# Patient Record
Sex: Male | Born: 1990 | Race: Black or African American | Hispanic: No | Marital: Single | Smoking: Current some day smoker
Health system: Southern US, Community
[De-identification: ages and names within clinical notes are randomized; demographics above are authoritative.]

---
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
## 2009-08-31 ENCOUNTER — Emergency Department (HOSPITAL_COMMUNITY): Admission: EM | Admit: 2009-08-31 | Discharge: 2009-08-31 | Payer: Self-pay | Admitting: Emergency Medicine

## 2010-05-08 LAB — SAMPLE TO BLOOD BANK

## 2010-05-08 LAB — POCT I-STAT, CHEM 8
BUN: 16 mg/dL (ref 6–23)
Chloride: 107 mEq/L (ref 96–112)
Creatinine, Ser: 1.2 mg/dL (ref 0.4–1.5)
Glucose, Bld: 101 mg/dL — ABNORMAL HIGH (ref 70–99)
HCT: 49 % (ref 39.0–52.0)
Hemoglobin: 16.7 g/dL (ref 13.0–17.0)

## 2014-03-22 ENCOUNTER — Emergency Department: Payer: Self-pay | Admitting: Physician Assistant

## 2015-03-06 ENCOUNTER — Emergency Department
Admission: EM | Admit: 2015-03-06 | Discharge: 2015-03-06 | Disposition: A | Discharge status: Home / Self Care | Payer: Self-pay | Attending: Emergency Medicine | Admitting: Emergency Medicine

## 2015-03-06 DIAGNOSIS — K0889 Other specified disorders of teeth and supporting structures: Secondary | ICD-10-CM | POA: Insufficient documentation

## 2015-03-06 MED ORDER — PENICILLIN V POTASSIUM 250 MG PO TABS: 250.0000 mg | ORAL_TABLET | Freq: Four times a day (QID) | ORAL | Status: DC

## 2015-03-06 MED ORDER — FENTANYL CITRATE (PF) 100 MCG/2ML IJ SOLN: 50.0000 ug | Freq: Once | INTRAMUSCULAR | Status: DC

## 2015-03-06 MED ORDER — HYDROCODONE-ACETAMINOPHEN 5-325 MG PO TABS
2.0000 | ORAL_TABLET | Freq: Once | ORAL | Status: AC
  Administered 2015-03-06: 2 via ORAL
  Filled 2015-03-06: qty 2

## 2015-03-06 MED ORDER — KETOROLAC TROMETHAMINE 30 MG/ML IJ SOLN
30.0000 mg | Freq: Once | INTRAMUSCULAR | Status: AC
  Administered 2015-03-06: 30 mg via INTRAMUSCULAR
  Filled 2015-03-06: qty 1

## 2015-03-06 MED ORDER — PENICILLIN V POTASSIUM 250 MG PO TABS
250.0000 mg | ORAL_TABLET | Freq: Once | ORAL | Status: AC
  Administered 2015-03-06: 250 mg via ORAL
  Filled 2015-03-06: qty 1

## 2015-03-06 MED ORDER — HYDROCODONE-ACETAMINOPHEN 5-325 MG PO TABS: 1.0000 | ORAL_TABLET | ORAL | Status: DC | PRN

## 2015-03-06 MED ORDER — FENTANYL CITRATE (PF) 100 MCG/2ML IJ SOLN
INTRAMUSCULAR | Status: AC
  Filled 2015-03-06: qty 2

## 2015-03-06 NOTE — ED Provider Notes (Signed)
[facility] Emergency Department Provider Note  ____________________________________________  Time seen: On arrival  I have reviewed the triage vital signs and the nursing notes.   HISTORY  Chief Complaint Dental Pain    HPI Mayra Puhl is a 25 y.o. male presents with dental pain. He reports he has been having tooth pain for partially 1 week that is getting steadily worse. He denies intraoral swelling or difficulty swallowing. No fevers or chills. No facial swelling. He states he will see a dentist this week    No past medical history on file.  There are no active problems to display for this patient.   No past surgical history on file.  No current outpatient prescriptions on file.  Allergies Review of patient's allergies indicates no known allergies.  No family history on file.  Social History Social History  Substance Use Topics  . Smoking status: Not on file  . Smokeless tobacco: Not on file  . Alcohol Use: Not on file   denies recent alcohol use, occasional cigarette use  Review of Systems  Constitutional: Negative for fever. . ENT: Negative for sore throat     Skin: Negative for rash. Neurological: Negative for headaches    ____________________________________________   PHYSICAL EXAM:  VITAL SIGNS: ED Triage Vitals  Enc Vitals Group     BP 03/06/15 0215 131/86 mmHg     Pulse Rate 03/06/15 0215 72     Resp 03/06/15 0215 14     Temp 03/06/15 0215 98.1 F (36.7 C)     Temp Source 03/06/15 0215 Oral     SpO2 03/06/15 0215 99 %     Weight 03/06/15 0215 138 lb (62.596 kg)     Height 03/06/15 0215 5\' 9"  (1.753 m)     Head Cir --      Peak Flow --      Pain Score 03/06/15 0155 10     Pain Loc --      Pain Edu? --      Excl. in [place]? --      Constitutional: Alert and oriented. Well appearing and in no distress. Eyes: Conjunctivae are normal.  ENT   Head: Normocephalic and atraumatic.   Mouth/Throat:  Mucous membranes are moist. No evidence of dental abscess. Dental tenderness to palpation in the right third upper molar although no surrounding erythema   Neurologic:  Normal speech and language.  Skin:  Skin is warm, dry and intact. No rash noted. Psychiatric: Mood and affect are normal. Patient exhibits appropriate insight and judgment.  ____________________________________________    LABS (pertinent positives/negatives)  Labs Reviewed - No data to display  ____________________________________________     ____________________________________________    RADIOLOGY I have personally reviewed any xrays that were ordered on this patient: None  ____________________________________________   PROCEDURES  Procedure(s) performed: none   ____________________________________________   INITIAL IMPRESSION / ASSESSMENT AND PLAN / ED COURSE  Pertinent labs & imaging results that were available during my care of the patient were reviewed by me and considered in my medical decision making (see chart for details).  Pain medication and penicillin given for dental pain. Advised dental follow-up ASAP  ____________________________________________   FINAL CLINICAL IMPRESSION(S) / ED DIAGNOSES  Final diagnoses:  Pain, dental     Lavonia Drafts, MD 03/06/15 [phone]

## 2015-03-06 NOTE — ED Notes (Signed)
Pt reports pain to right upper back tooth for about 1 week that is getting worse.

## 2015-06-10 ENCOUNTER — Emergency Department
Admission: EM | Admit: 2015-06-10 | Discharge: 2015-06-10 | Disposition: A | Discharge status: Home / Self Care | Payer: Self-pay | Attending: Emergency Medicine | Admitting: Emergency Medicine

## 2015-06-10 ENCOUNTER — Encounter: Payer: Self-pay | Admitting: Medical Oncology

## 2015-06-10 DIAGNOSIS — L0201 Cutaneous abscess of face: Secondary | ICD-10-CM | POA: Insufficient documentation

## 2015-06-10 MED ORDER — IBUPROFEN 800 MG PO TABS: 800.0000 mg | ORAL_TABLET | Freq: Three times a day (TID) | ORAL | Status: DC | PRN

## 2015-06-10 MED ORDER — HYDROCODONE-ACETAMINOPHEN 5-325 MG PO TABS: 1.0000 | ORAL_TABLET | ORAL | Status: DC | PRN

## 2015-06-10 MED ORDER — SULFAMETHOXAZOLE-TRIMETHOPRIM 800-160 MG PO TABS: 1.0000 | ORAL_TABLET | Freq: Two times a day (BID) | ORAL | Status: DC

## 2015-06-10 NOTE — ED Notes (Signed)
Knot to rt side of face for a couple months.

## 2015-06-10 NOTE — ED Provider Notes (Signed)
[facility] Emergency Department Provider Note  ____________________________________________  Time seen: Approximately 7:46 PM  I have reviewed the triage vital signs and the nursing notes.   HISTORY  Chief Complaint Abscess    HPI Trevor Rocha is a 25 y.o. male presents for evaluation of not on a [place] of his face for last couple months however progressively has gotten worse over the last 2 days. Denies any trauma. 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 Denies any fever chills.   History reviewed. No pertinent past medical history.  There are no active problems to display for this patient.   History reviewed. No pertinent past surgical history.  Current Outpatient Rx  Name  Route  Sig  Dispense  Refill  . HYDROcodone-acetaminophen (NORCO) 5-325 MG tablet   Oral   Take 1-2 tablets by mouth every 4 (four) hours as needed for moderate pain.   15 tablet   0   . ibuprofen (ADVIL,MOTRIN) 800 MG tablet   Oral   Take 1 tablet (800 mg total) by mouth every 8 (eight) hours as needed.   30 tablet   0   . sulfamethoxazole-trimethoprim (BACTRIM DS,SEPTRA DS) 800-160 MG tablet   Oral   Take 1 tablet by mouth 2 (two) times daily.   20 tablet   0     Allergies Review of patient's allergies indicates no known allergies.  No family history on file.  Social History Social History  Substance Use Topics  . Smoking status: None  . Smokeless tobacco: None  . Alcohol Use: None    Review of Systems Constitutional: No fever/chills Eyes: No visual changes. ENT: No sore throat. Cardiovascular: Denies chest pain. Respiratory: Denies shortness of breath. Musculoskeletal: Negative for back pain. Skin: Positive for skin abscess to the right side of the cheek. Neurological: Negative for headaches, focal weakness or numbness.  10-point ROS otherwise  negative.  ____________________________________________   PHYSICAL EXAM:  VITAL SIGNS: ED Triage Vitals  Enc Vitals Group     BP 06/10/15 1852 132/76 mmHg     Pulse Rate 06/10/15 1852 65     Resp 06/10/15 1852 18     Temp 06/10/15 1852 98.6 F (37 C)     Temp Source 06/10/15 1852 Oral     SpO2 06/10/15 1852 100 %     Weight 06/10/15 1852 140 lb (63.504 kg)     Height 06/10/15 1852 5\' 9"  (1.753 m)     Head Cir --      Peak Flow --      Pain Score --      Pain Loc --      Pain Edu? --      Excl. in [place]? --     Constitutional: Alert and oriented. Well appearing and in no acute distress. Head: Atraumatic.Positive abscess approximately 1-1/2-2 cm nummular infraorbital to the right eye. On the upper cheek. Positive tenderness positive erythema no pustular discharge mild fluctuance. Nose: No congestion/rhinnorhea. Mouth/Throat: Mucous membranes are moist.  Oropharynx non-erythematous. Neck: No stridor.   Cardiovascular: Normal rate, regular rhythm. Grossly normal heart sounds.  Good peripheral circulation. Respiratory: Normal respiratory effort.  No retractions. Lungs CTAB. Skin:  Skin is warm, dry and intact. No rash noted. As described above Psychiatric: Mood and affect are normal. Speech and behavior are normal.  ____________________________________________   LABS (all labs ordered are listed, but only abnormal results  are displayed)  Labs Reviewed - No data to display ____________________________________________  EKG   ____________________________________________  RADIOLOGY   ____________________________________________   PROCEDURES  Procedure(s) performed: None  Critical Care performed: No  ____________________________________________   INITIAL IMPRESSION / ASSESSMENT AND PLAN / ED COURSE  Pertinent labs & imaging results that were available during my care of the patient were reviewed by me and considered in my medical decision making (see chart for  details).  Cutaneous abscess of the right cheek. Rx given for Bactrim DS twice a day, Motrin 800 mg 3 times a day and Norco 5/325. Patient follow-up PCP or return here with any worsening symptomology. Patient voices no other emergency medical complaints at this time. ____________________________________________   FINAL CLINICAL IMPRESSION(S) / ED DIAGNOSES  Final diagnoses:  Facial abscess     This chart was dictated using voice recognition software/Dragon. Despite best efforts to proofread, errors can occur which can change the meaning. Any change was purely unintentional.   Arlyss Repress, PA-C 06/10/15 1957  Wandra Arthurs, MD 06/10/15 [phone]

## 2015-06-10 NOTE — ED Notes (Signed)
Pt presents to ED w/ abscess to right cheek bone x 3 days; pt reports pain/tenderness.  Area swollen, tender, no discharge from site.  PA at bedside.

## 2015-06-10 NOTE — Discharge Instructions (Signed)

## 2015-11-13 ENCOUNTER — Emergency Department
Admission: EM | Admit: 2015-11-13 | Discharge: 2015-11-13 | Disposition: A | Discharge status: Home / Self Care | Payer: Self-pay | Attending: Emergency Medicine | Admitting: Emergency Medicine

## 2015-11-13 ENCOUNTER — Encounter: Payer: Self-pay | Admitting: Emergency Medicine

## 2015-11-13 ENCOUNTER — Emergency Department: Payer: Self-pay

## 2015-11-13 DIAGNOSIS — Z5181 Encounter for therapeutic drug level monitoring: Secondary | ICD-10-CM | POA: Insufficient documentation

## 2015-11-13 DIAGNOSIS — R1115 Cyclical vomiting syndrome unrelated to migraine: Secondary | ICD-10-CM

## 2015-11-13 DIAGNOSIS — F17200 Nicotine dependence, unspecified, uncomplicated: Secondary | ICD-10-CM | POA: Insufficient documentation

## 2015-11-13 DIAGNOSIS — G43A0 Cyclical vomiting, not intractable: Secondary | ICD-10-CM | POA: Insufficient documentation

## 2015-11-13 LAB — COMPREHENSIVE METABOLIC PANEL
ALBUMIN: 4.4 g/dL (ref 3.5–5.0)
ALT: 22 U/L (ref 17–63)
ANION GAP: 14 (ref 5–15)
AST: 42 U/L — ABNORMAL HIGH (ref 15–41)
Alkaline Phosphatase: 52 U/L (ref 38–126)
BILIRUBIN TOTAL: 0.6 mg/dL (ref 0.3–1.2)
BUN: 18 mg/dL (ref 6–20)
CALCIUM: 9 mg/dL (ref 8.9–10.3)
CO2: 20 mmol/L — ABNORMAL LOW (ref 22–32)
Chloride: 103 mmol/L (ref 101–111)
Creatinine, Ser: 1.07 mg/dL (ref 0.61–1.24)
GLUCOSE: 83 mg/dL (ref 65–99)
POTASSIUM: 3.9 mmol/L (ref 3.5–5.1)
Sodium: 137 mmol/L (ref 135–145)
TOTAL PROTEIN: 7.6 g/dL (ref 6.5–8.1)

## 2015-11-13 LAB — URINALYSIS COMPLETE WITH MICROSCOPIC (ARMC ONLY)
BACTERIA UA: NONE SEEN
BILIRUBIN URINE: NEGATIVE
GLUCOSE, UA: NEGATIVE mg/dL
LEUKOCYTES UA: NEGATIVE
NITRITE: NEGATIVE
PH: 5 (ref 5.0–8.0)
Protein, ur: 30 mg/dL — AB
SPECIFIC GRAVITY, URINE: 1.023 (ref 1.005–1.030)
SQUAMOUS EPITHELIAL / LPF: NONE SEEN

## 2015-11-13 LAB — ETHANOL: ALCOHOL ETHYL (B): 12 mg/dL — AB (ref <5)

## 2015-11-13 LAB — CBC WITH DIFFERENTIAL/PLATELET
BASOS ABS: 0 10*3/uL (ref 0–0.1)
BASOS PCT: 0 %
Eosinophils Absolute: 0 10*3/uL (ref 0–0.7)
Eosinophils Relative: 0 %
HEMATOCRIT: 42.6 % (ref 40.0–52.0)
Hemoglobin: 13.9 g/dL (ref 13.0–18.0)
LYMPHS PCT: 12 %
Lymphs Abs: 2.2 10*3/uL (ref 1.0–3.6)
MCH: 29.1 pg (ref 26.0–34.0)
MCHC: 32.6 g/dL (ref 32.0–36.0)
MCV: 89.4 fL (ref 80.0–100.0)
Monocytes Absolute: 0.7 10*3/uL (ref 0.2–1.0)
Monocytes Relative: 4 %
NEUTROS ABS: 15 10*3/uL — AB (ref 1.4–6.5)
NEUTROS PCT: 84 %
Platelets: 190 10*3/uL (ref 150–440)
RBC: 4.76 MIL/uL (ref 4.40–5.90)
RDW: 14.3 % (ref 11.5–14.5)
WBC: 17.9 10*3/uL — AB (ref 3.8–10.6)

## 2015-11-13 LAB — URINE DRUG SCREEN, QUALITATIVE (ARMC ONLY)
AMPHETAMINES, UR SCREEN: NOT DETECTED
BENZODIAZEPINE, UR SCRN: NOT DETECTED
Barbiturates, Ur Screen: NOT DETECTED
Cannabinoid 50 Ng, Ur ~~LOC~~: POSITIVE — AB
Cocaine Metabolite,Ur ~~LOC~~: NOT DETECTED
MDMA (ECSTASY) UR SCREEN: NOT DETECTED
METHADONE SCREEN, URINE: NOT DETECTED
OPIATE, UR SCREEN: NOT DETECTED
PHENCYCLIDINE (PCP) UR S: NOT DETECTED
Tricyclic, Ur Screen: NOT DETECTED

## 2015-11-13 MED ORDER — SODIUM CHLORIDE 0.9 % IV SOLN
Freq: Once | INTRAVENOUS | Status: AC
  Administered 2015-11-13: 08:00:00 via INTRAVENOUS

## 2015-11-13 MED ORDER — ONDANSETRON HCL 4 MG/2ML IJ SOLN
INTRAMUSCULAR | Status: AC
  Filled 2015-11-13: qty 2

## 2015-11-13 MED ORDER — SODIUM CHLORIDE 0.9 % IV SOLN: Freq: Once | INTRAVENOUS | Status: DC

## 2015-11-13 MED ORDER — ONDANSETRON HCL 4 MG/2ML IJ SOLN
4.0000 mg | Freq: Once | INTRAMUSCULAR | Status: AC
  Administered 2015-11-13: 4 mg via INTRAVENOUS

## 2015-11-13 NOTE — ED Triage Notes (Signed)
Pt to ed via ems from home with vomiting. Pt states when he woke up his sheets were covered in sweat. Pt c/o n/v. Pt states he drank about 4-5 shots of tequila last night.

## 2015-11-13 NOTE — Discharge Instructions (Signed)
Please return for fever abdominal pain or continued vomiting.

## 2015-11-13 NOTE — ED Provider Notes (Signed)
[facility] Emergency Department Provider Note   ____________________________________________   First MD Initiated Contact with Patient 11/13/15 [id number]     (approximate)  I have reviewed the triage vital signs and the nursing notes.   HISTORY  Chief Complaint Emesis    HPI Trevor Rocha is a 25 y.o. male patient reports he was drinking last night and had 4-5 shots of vodka. He doesn't usually drink quite that much. Went to bed and when he woke up in the morning he vomited some and was sweating like crazy some sheets were soaked. He's never sweated that much either. Now he just feels cold and he shivering in the ER. Nothing is really hurting and he is not coughing but he was very worried about all the sweating.   History reviewed. No pertinent past medical history.  There are no active problems to display for this patient.   History reviewed. No pertinent surgical history.  Prior to Admission medications   Medication Sig Start Date End Date Taking? Authorizing Provider  HYDROcodone-acetaminophen (NORCO) 5-325 MG tablet Take 1-2 tablets by mouth every 4 (four) hours as needed for moderate pain. 06/10/15   Pierce Crane Beers, PA-C  ibuprofen (ADVIL,MOTRIN) 800 MG tablet Take 1 tablet (800 mg total) by mouth every 8 (eight) hours as needed. 06/10/15   Pierce Crane Beers, PA-C  sulfamethoxazole-trimethoprim (BACTRIM DS,SEPTRA DS) 800-160 MG tablet Take 1 tablet by mouth 2 (two) times daily. 06/10/15   Arlyss Repress, PA-C    Allergies Review of patient's allergies indicates no known allergies.  No family history on file.  Social History Social History  Substance Use Topics  . Smoking status: Current Some Day Smoker  . Smokeless tobacco: Never Used  . Alcohol use Yes     Comment: "I had about 4-5 shots of tequila last night"    Review of Systems Constitutional: No feverPatient is shivering Eyes: No visual changes. ENT: No sore  throat. Cardiovascular: Denies chest pain. Respiratory: Denies shortness of breath. Gastrointestinal: No abdominal pain.   No diarrhea.  No constipation. Genitourinary: Negative for dysuria. Musculoskeletal: Negative for back pain. Skin: Negative for rash. Neurological: Negative for headaches, focal weakness or numbness.  10-point ROS otherwise negative.  ____________________________________________   PHYSICAL EXAM:  VITAL SIGNS: ED Triage Vitals [11/13/15 0712]  Enc Vitals Group     BP 117/64     Pulse Rate 65     Resp (!) 22     Temp      Temp Source Oral     SpO2 100 %     Weight 140 lb (63.5 kg)     Height 5\' 10"  (1.778 m)     Head Circumference      Peak Flow      Pain Score      Pain Loc      Pain Edu?      Excl. in [place]?     Constitutional: Alert and oriented. Well appearing and in no acute distress. Eyes: Conjunctivae are normal. PERRL. EOMI. Head: Atraumatic. Nose: No congestion/rhinnorhea. Mouth/Throat: Mucous membranes are moist.  Oropharynx non-erythematous. Neck: No stridor.   Cardiovascular: Normal rate, regular rhythm. Grossly normal heart sounds.  Good peripheral circulation. Respiratory: Normal respiratory effort.  No retractions. Lungs CTAB. Gastrointestinal: Soft and nontender. No distention. No abdominal bruits. No CVA tenderness. Musculoskeletal: No lower extremity tenderness nor edema.  No joint effusions. Neurologic:  Normal speech and language. No gross focal neurologic deficits are appreciated. No  gait instability. Skin:  Skin is warm, dry and intact. No rash noted. Psychiatric: Mood and affect are normal. Speech and behavior are normal.  ____________________________________________   LABS (all labs ordered are listed, but only abnormal results are displayed)  Labs Reviewed  COMPREHENSIVE METABOLIC PANEL - Abnormal; Notable for the following:       Result Value   CO2 20 (*)    AST 42 (*)    All other components within normal limits   CBC WITH DIFFERENTIAL/PLATELET - Abnormal; Notable for the following:    WBC 17.9 (*)    Neutro Abs 15.0 (*)    All other components within normal limits  URINE DRUG SCREEN, QUALITATIVE ([facility] ONLY) - Abnormal; Notable for the following:    Cannabinoid 50 Ng, Ur [place] POSITIVE (*)    All other components within normal limits  ETHANOL - Abnormal; Notable for the following:    Alcohol, Ethyl (B) 12 (*)    All other components within normal limits  URINALYSIS COMPLETEWITH MICROSCOPIC ([facility] ONLY) - Abnormal; Notable for the following:    Color, Urine YELLOW (*)    APPearance CLEAR (*)    Ketones, ur 1+ (*)    Hgb urine dipstick 1+ (*)    Protein, ur 30 (*)    All other components within normal limits   ____________________________________________  EKG  ____________________________________________  RADIOLOGY  CHEST  2 VIEW  COMPARISON:  None.  FINDINGS: The cardiomediastinal silhouette is within normal limits. The lungs are well inflated and clear. There is no evidence of pleural effusion or pneumothorax. No acute osseous abnormality is identified.  IMPRESSION: No active cardiopulmonary disease.   Electronically Signed   By: Logan Bores M.D.   On: 11/13/2015 09:55 ____________________________________________   PROCEDURES  Procedure(s) performed:  Procedures  Critical Care performed:   ____________________________________________   INITIAL IMPRESSION / ASSESSMENT AND PLAN / ED COURSE  Pertinent labs & imaging results that were available during my care of the patient were reviewed by me and considered in my medical decision making (see chart for details).  Patient is feeling better now everything looks okay have no explanation for the elevated white count with him to home.  Clinical Course     ____________________________________________   FINAL CLINICAL IMPRESSION(S) / ED DIAGNOSES  Final diagnoses:  Non-intractable cyclical vomiting with  nausea      NEW MEDICATIONS STARTED DURING THIS VISIT:  New Prescriptions   No medications on file     Note:  This document was prepared using Dragon voice recognition software and may include unintentional dictation errors.    Nena Polio, MD 11/13/15 [phone]

## 2015-12-29 ENCOUNTER — Encounter: Payer: Self-pay | Admitting: Occupational Medicine

## 2015-12-29 ENCOUNTER — Emergency Department
Admission: EM | Admit: 2015-12-29 | Discharge: 2015-12-29 | Disposition: A | Discharge status: Home / Self Care | Payer: No Typology Code available for payment source | Attending: Emergency Medicine | Admitting: Emergency Medicine

## 2015-12-29 DIAGNOSIS — S0990XA Unspecified injury of head, initial encounter: Secondary | ICD-10-CM | POA: Insufficient documentation

## 2015-12-29 DIAGNOSIS — Y939 Activity, unspecified: Secondary | ICD-10-CM | POA: Diagnosis not present

## 2015-12-29 DIAGNOSIS — Y92410 Unspecified street and highway as the place of occurrence of the external cause: Secondary | ICD-10-CM | POA: Diagnosis not present

## 2015-12-29 DIAGNOSIS — F17200 Nicotine dependence, unspecified, uncomplicated: Secondary | ICD-10-CM | POA: Diagnosis not present

## 2015-12-29 DIAGNOSIS — Y999 Unspecified external cause status: Secondary | ICD-10-CM | POA: Insufficient documentation

## 2015-12-29 DIAGNOSIS — S39012A Strain of muscle, fascia and tendon of lower back, initial encounter: Secondary | ICD-10-CM | POA: Insufficient documentation

## 2015-12-29 DIAGNOSIS — S3992XA Unspecified injury of lower back, initial encounter: Secondary | ICD-10-CM | POA: Diagnosis present

## 2015-12-29 MED ORDER — CYCLOBENZAPRINE HCL 10 MG PO TABS: 10.0000 mg | ORAL_TABLET | Freq: Three times a day (TID) | ORAL | 0 refills | Status: DC | PRN

## 2015-12-29 MED ORDER — NAPROXEN 500 MG PO TABS: 500.0000 mg | ORAL_TABLET | Freq: Two times a day (BID) | ORAL | 0 refills | Status: DC
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
## 2016-10-04 ENCOUNTER — Encounter: Payer: Self-pay | Admitting: Emergency Medicine

## 2016-10-04 ENCOUNTER — Emergency Department
Admission: EM | Admit: 2016-10-04 | Discharge: 2016-10-04 | Disposition: A | Discharge status: Home / Self Care | Payer: Self-pay | Attending: Emergency Medicine | Admitting: Emergency Medicine

## 2016-10-04 DIAGNOSIS — J029 Acute pharyngitis, unspecified: Secondary | ICD-10-CM | POA: Insufficient documentation

## 2016-10-04 DIAGNOSIS — F17200 Nicotine dependence, unspecified, uncomplicated: Secondary | ICD-10-CM | POA: Insufficient documentation

## 2016-10-04 LAB — POCT RAPID STREP A: STREPTOCOCCUS, GROUP A SCREEN (DIRECT): NEGATIVE

## 2016-10-04 MED ORDER — LIDOCAINE VISCOUS 2 % MT SOLN: 10.0000 mL | OROMUCOSAL | 0 refills | Status: DC | PRN

## 2016-10-04 MED ORDER — PREDNISONE 10 MG PO TABS: ORAL_TABLET | ORAL | 0 refills | Status: DC

## 2016-10-04 MED ORDER — DEXAMETHASONE SODIUM PHOSPHATE 10 MG/ML IJ SOLN
10.0000 mg | Freq: Once | INTRAMUSCULAR | Status: AC
  Administered 2016-10-04: 10 mg via INTRAMUSCULAR
  Filled 2016-10-04: qty 1

## 2016-10-04 MED ORDER — LIDOCAINE VISCOUS 2 % MT SOLN
15.0000 mL | Freq: Once | OROMUCOSAL | Status: AC
  Administered 2016-10-04: 15 mL via OROMUCOSAL
  Filled 2016-10-04: qty 15

## 2016-10-04 MED ORDER — DIPHENHYDRAMINE HCL 25 MG PO CAPS
25.0000 mg | ORAL_CAPSULE | Freq: Once | ORAL | Status: AC
  Administered 2016-10-04: 25 mg via ORAL
  Filled 2016-10-04: qty 1

## 2016-10-04 NOTE — ED Notes (Signed)
See triage note  States developed sore throat about 4 days ago   Sx's are worse today  having increased pain with swallowing  Unsure of fever at home but afebrile on arrival

## 2016-10-04 NOTE — ED Triage Notes (Signed)
Pt to ed with c/o sore throat x 4 days.  Pt reports unable to swallow saliva due to pain. Denies fever.

## 2016-10-04 NOTE — ED Provider Notes (Signed)
[facility] Emergency Department Provider Note  ____________________________________________  Time seen: Approximately 10:13 AM  I have reviewed the triage vital signs and the nursing notes.   HISTORY  Chief Complaint Sore Throat    HPI Trevor Rocha is a 26 y.o. male that presents to the emergency department with a sore throat for 4 days.He is having difficulty swallowing due to pain. Pain is on the inside of his throat. No sick contacts. He denies fever, nasal congestion, cough, shortness breath, chest pain, nausea, vomiting, abdominal pain.   History reviewed. No pertinent past medical history.  There are no active problems to display for this patient.   History reviewed. No pertinent surgical history.  Prior to Admission medications   Medication Sig Start Date End Date Taking? Authorizing Provider  cyclobenzaprine (FLEXERIL) 10 MG tablet Take 1 tablet (10 mg total) by mouth 3 (three) times daily as needed for muscle spasms. 12/29/15   Triplett, Johnette Abraham B, FNP  HYDROcodone-acetaminophen (NORCO) 5-325 MG tablet Take 1-2 tablets by mouth every 4 (four) hours as needed for moderate pain. 06/10/15   Beers, Pierce Crane, PA-C  lidocaine (XYLOCAINE) 2 % solution Use as directed 10 mLs in the mouth or throat as needed for mouth pain. 10/04/16   Laban Emperor, PA-C  naproxen (NAPROSYN) 500 MG tablet Take 1 tablet (500 mg total) by mouth 2 (two) times daily with a meal. 12/29/15   Triplett, Cari B, FNP  predniSONE (DELTASONE) 10 MG tablet Take 6 tablets on day 1, take 5 tablets on day 2, take 4 tablets on day 3, take 3 tablets on day 4, take 2 tablets on day 5, take 1 tablet on day 6 10/04/16   Laban Emperor, PA-C    Allergies Patient has no known allergies.  No family history on file.  Social History Social History  Substance Use Topics  . Smoking status: Current Some Day Smoker  . Smokeless tobacco: Never Used  . Alcohol use Yes     Comment: "I had about  4-5 shots of tequila last night"     Review of Systems  Constitutional: No fever/chills ENT: Negative for congestion and rhinorrhea. Cardiovascular: No chest pain. Respiratory: Negative for cough. No SOB. Gastrointestinal: No abdominal pain.  No nausea, no vomiting.  No diarrhea.  No constipation. Musculoskeletal: Negative for musculoskeletal pain. Skin: Negative for rash, abrasions, lacerations, ecchymosis. Neurological: Negative for headaches.   ____________________________________________   PHYSICAL EXAM:  VITAL SIGNS: ED Triage Vitals [10/04/16 0953]  Enc Vitals Group     BP (!) 141/91     Pulse Rate 93     Resp 20     Temp 97.6 F (36.4 C)     Temp Source Oral     SpO2 100 %     Weight 130 lb (59 kg)     Height 5\' 10"  (1.778 m)     Head Circumference      Peak Flow      Pain Score 10     Pain Loc      Pain Edu?      Excl. in [place]?      Constitutional: Alert and oriented. Well appearing and in no acute distress. Eyes: Conjunctivae are normal. PERRL. EOMI. No discharge. Head: Atraumatic. ENT: No frontal and maxillary sinus tenderness.      Ears: Tympanic membranes pearly gray with good landmarks. No discharge.      Nose: No congestion/rhinnorhea.      Mouth/Throat: Mucous membranes are  moist. Oropharynx erythematous. Tonsils not enlarged. No exudates. Uvula midline. Neck: No stridor.   Hematological/Lymphatic/Immunilogical: No cervical lymphadenopathy. Cardiovascular: Normal rate, regular rhythm.  Good peripheral circulation. Respiratory: Normal respiratory effort without tachypnea or retractions. Lungs CTAB. Good air entry to the bases with no decreased or absent breath sounds. Gastrointestinal: Bowel sounds 4 quadrants. Soft and nontender to palpation. No guarding or rigidity. No palpable masses. No distention. Musculoskeletal: Full range of motion to all extremities. No gross deformities appreciated. Neurologic:  Normal speech and language. No gross focal  neurologic deficits are appreciated.  Skin:  Skin is warm, dry and intact. No rash noted.   ____________________________________________   LABS (all labs ordered are listed, but only abnormal results are displayed)  Labs Reviewed  POCT RAPID STREP A   ____________________________________________  EKG   ____________________________________________  RADIOLOGY  No results found.  ____________________________________________    PROCEDURES  Procedure(s) performed:    Procedures    Medications  dexamethasone (DECADRON) injection 10 mg (10 mg Intramuscular Given 10/04/16 1140)  lidocaine (XYLOCAINE) 2 % viscous mouth solution 15 mL (15 mLs Mouth/Throat Given 10/04/16 1141)  diphenhydrAMINE (BENADRYL) capsule 25 mg (25 mg Oral Given 10/04/16 1141)     ____________________________________________   INITIAL IMPRESSION / ASSESSMENT AND PLAN / ED COURSE  Pertinent labs & imaging results that were available during my care of the patient were reviewed by me and considered in my medical decision making (see chart for details).  Review of the [place] CSRS was performed in accordance of the [place] prior to dispensing any controlled drugs.     Patient's diagnosis is consistent with viral pharyngitis. Vital signs and exam are reassuring. Strep was negative. Patient felt extremely better after viscous lidocaine. Patient will be discharged home with prescriptions for prednisone and lidocaine. Patient is to follow up with PCP as needed or otherwise directed. Patient is given ED precautions to return to the ED for any worsening or new symptoms.     ____________________________________________  FINAL CLINICAL IMPRESSION(S) / ED DIAGNOSES  Final diagnoses:  Viral pharyngitis      NEW MEDICATIONS STARTED DURING THIS VISIT:  Discharge Medication List as of 10/04/2016 12:01 PM    START taking these medications   Details  lidocaine (XYLOCAINE) 2 % solution Use as directed 10 mLs  in the mouth or throat as needed for mouth pain., Starting Wed 10/04/2016, Print    predniSONE (DELTASONE) 10 MG tablet Take 6 tablets on day 1, take 5 tablets on day 2, take 4 tablets on day 3, take 3 tablets on day 4, take 2 tablets on day 5, take 1 tablet on day 6, Print            This chart was dictated using voice recognition software/Dragon. Despite best efforts to proofread, errors can occur which can change the meaning. Any change was purely unintentional.    Laban Emperor, PA-C 10/04/16 1846    Earleen Newport, MD 10/05/16 1501

## 2017-02-09 IMAGING — CR DG CHEST 2V
2 series · 3 of 3 positions shown · non-contrast
Comparison: None.

CLINICAL DATA: Passed out from alcohol use. Sweating in shivering.
Smoker. Evaluate for aspiration.

EXAM:
CHEST  2 VIEW

[chest lat]
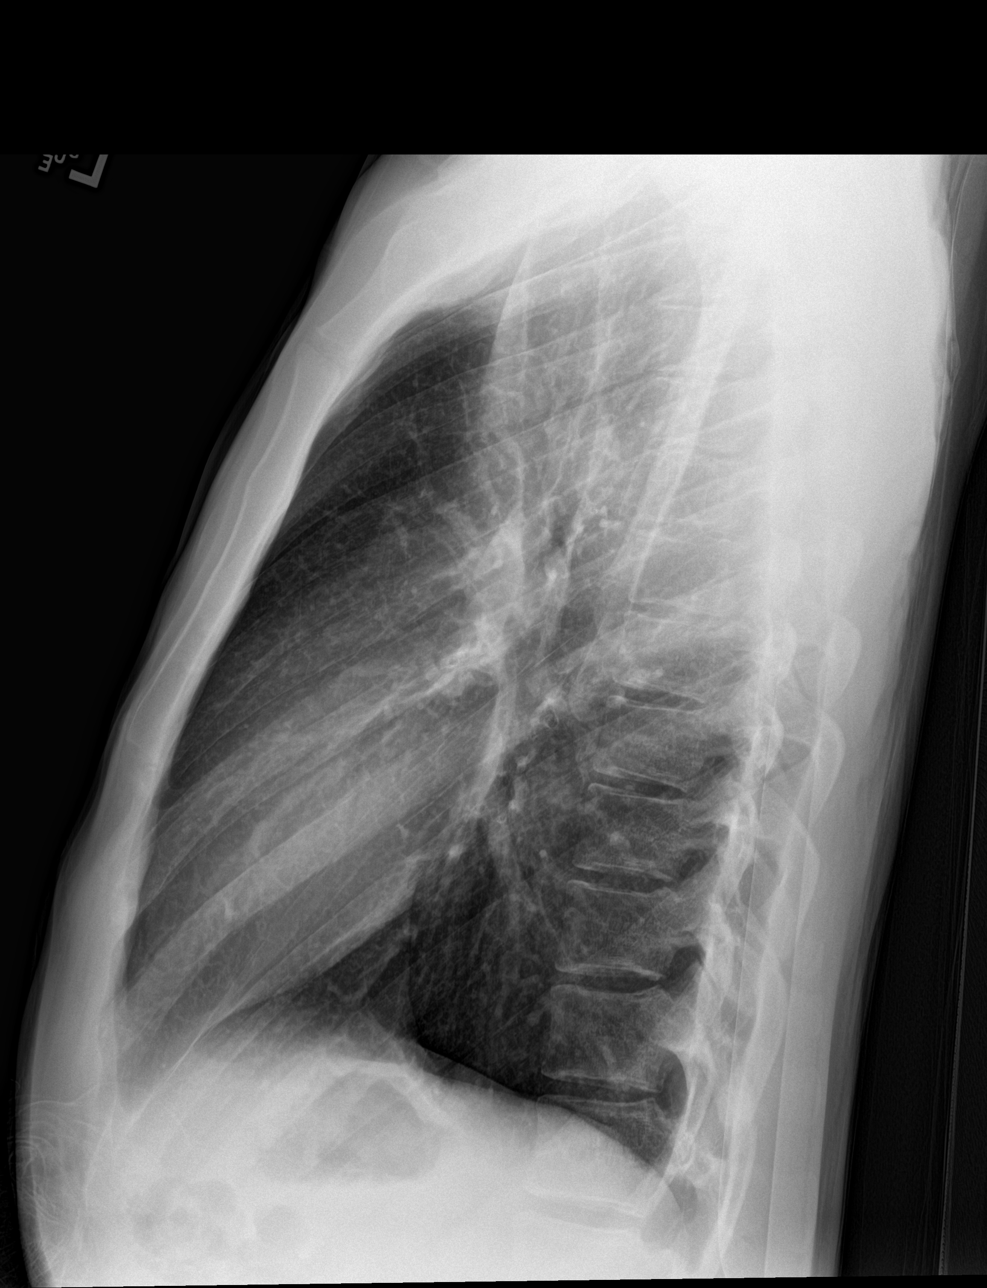

[Series 3: chest ap · 0.14mm/px · 2 of 2 slices shown]
[im 1/2]
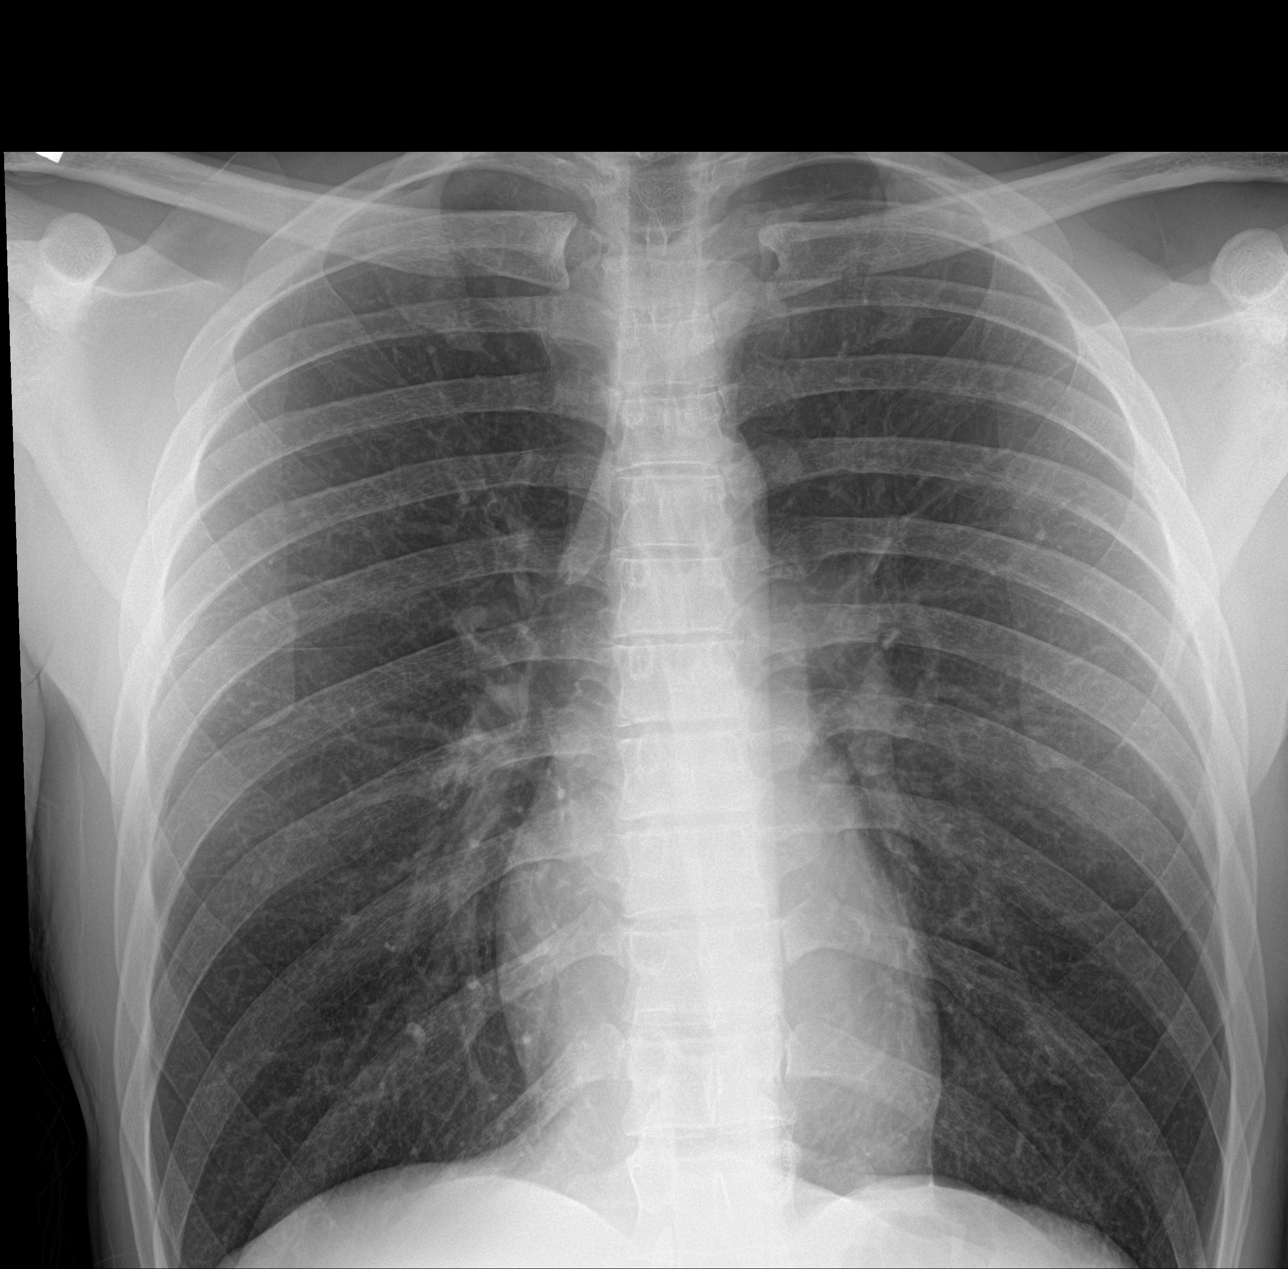
[im 2/2]
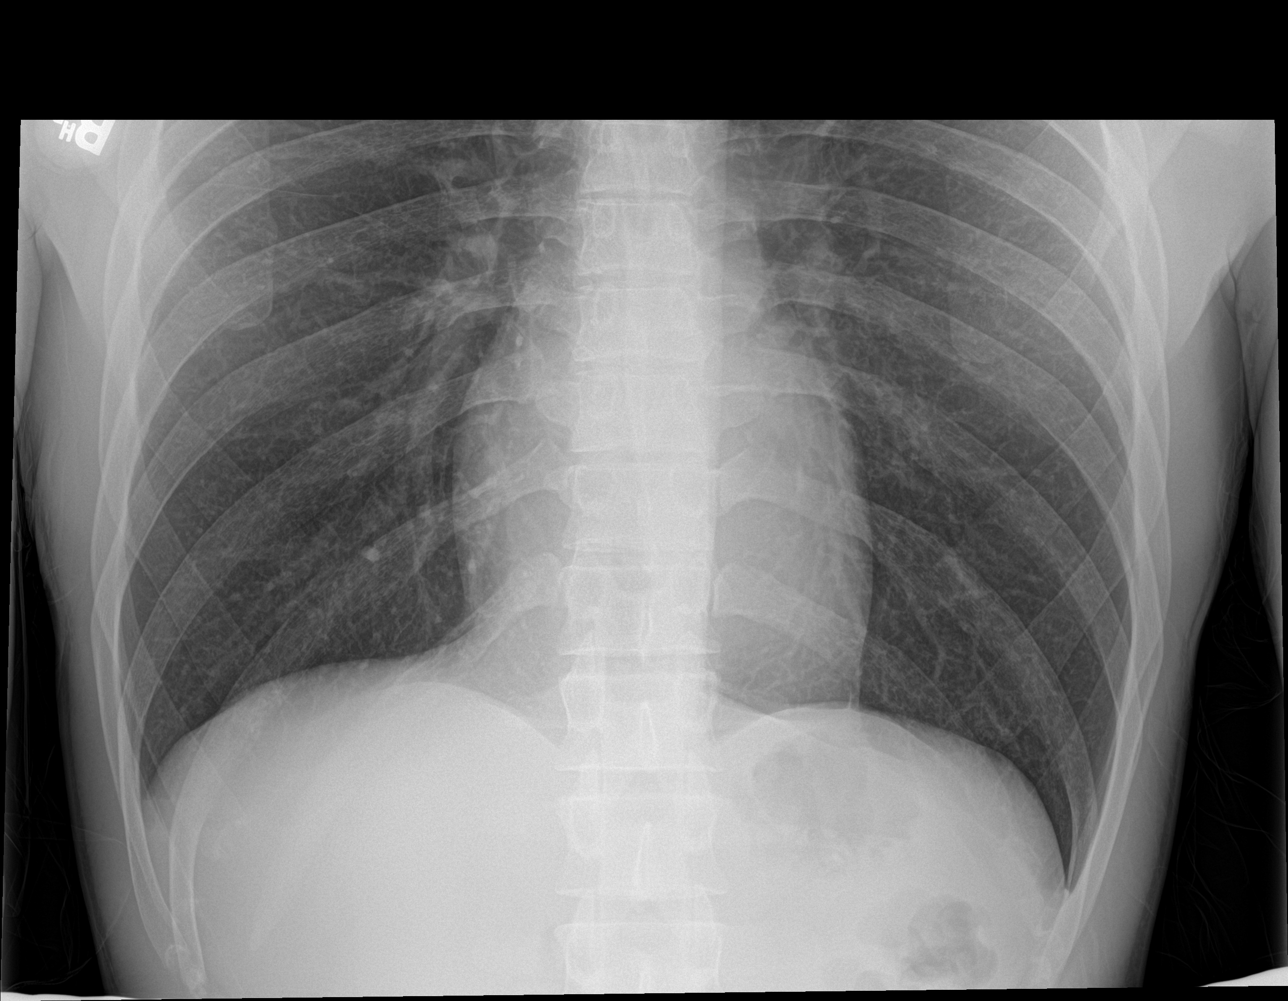

[3 of 3 positions shown; findings below may reference images not displayed]

FINDINGS: The cardiomediastinal silhouette is within normal limits. The lungs
are well inflated and clear. There is no evidence of pleural
effusion or pneumothorax. No acute osseous abnormality is
identified.
IMPRESSION: No active cardiopulmonary disease.

## 2018-10-28 ENCOUNTER — Other Ambulatory Visit: Payer: Self-pay

## 2018-10-28 ENCOUNTER — Emergency Department: Payer: Self-pay

## 2018-10-28 ENCOUNTER — Emergency Department
Admission: EM | Admit: 2018-10-28 | Discharge: 2018-10-28 | Disposition: A | Discharge status: Home / Self Care | Payer: Self-pay | Attending: Emergency Medicine | Admitting: Emergency Medicine

## 2018-10-28 ENCOUNTER — Encounter: Payer: Self-pay | Admitting: Emergency Medicine

## 2018-10-28 DIAGNOSIS — L02511 Cutaneous abscess of right hand: Secondary | ICD-10-CM | POA: Insufficient documentation

## 2018-10-28 DIAGNOSIS — F17210 Nicotine dependence, cigarettes, uncomplicated: Secondary | ICD-10-CM | POA: Insufficient documentation

## 2018-10-28 MED ORDER — BUPIVACAINE HCL (PF) 0.5 % IJ SOLN
10.0000 mL | Freq: Once | INTRAMUSCULAR | Status: AC
  Administered 2018-10-28: 06:00:00 10 mL
  Filled 2018-10-28: qty 30

## 2018-10-28 MED ORDER — SULFAMETHOXAZOLE-TRIMETHOPRIM 800-160 MG PO TABS: 1.0000 | ORAL_TABLET | Freq: Two times a day (BID) | ORAL | 0 refills | Status: AC

## 2018-10-28 MED ORDER — LIDOCAINE HCL (PF) 1 % IJ SOLN
5.0000 mL | Freq: Once | INTRAMUSCULAR | Status: AC
  Administered 2018-10-28: 5 mL
  Filled 2018-10-28: qty 5

## 2018-10-28 MED ORDER — CEPHALEXIN 500 MG PO CAPS: 500.0000 mg | ORAL_CAPSULE | Freq: Three times a day (TID) | ORAL | 0 refills | Status: AC

## 2018-10-28 MED ORDER — HYDROCODONE-ACETAMINOPHEN 5-325 MG PO TABS
2.0000 | ORAL_TABLET | Freq: Once | ORAL | Status: AC
  Administered 2018-10-28: 06:00:00 2 via ORAL
  Filled 2018-10-28: qty 2

## 2018-10-28 MED ORDER — IBUPROFEN 600 MG PO TABS: 600.0000 mg | ORAL_TABLET | Freq: Three times a day (TID) | ORAL | 0 refills | Status: DC | PRN

## 2018-10-28 NOTE — ED Triage Notes (Addendum)
Pt says a over a month ago he got a small cut on his right index finger and he's been trying to nurse it at home with neosporin; now he has pain and swelling; is concerned he has an infection; the pain is keeping him from sleeping; pt ambulatory with steady gait; talking in complete coherent sentences

## 2018-10-28 NOTE — ED Provider Notes (Signed)
[facility] Emergency Department Provider Note  ____________________________________________   First MD Initiated Contact with Patient 10/28/18 [phone]     (approximate)  I have reviewed the triage vital signs and the nursing notes.   HISTORY  Chief Complaint Finger Injury    HPI Trevor Rocha is a 28 y.o. male  Here with righ tindex finger pain. Pt reports that approx 1 month ago, he develop a small wound to his right index finger at work. Works in [employer]. Since then, he's had persistent pain and a draining wound to the area, which intermittently will "heal up" then come back. He reports that he has an aching, throbbing pain constantly but over the past few days, this has worsened and he's begun to have new drainage. No fever, chills. No pain proximal to his finger. Denies any concern for FB. Tetanus is UTD.        History reviewed. No pertinent past medical history.  There are no active problems to display for this patient.   History reviewed. No pertinent surgical history.  Prior to Admission medications   Medication Sig Start Date End Date Taking? Authorizing Provider  cephALEXin (KEFLEX) 500 MG capsule Take 1 capsule (500 mg total) by mouth 3 (three) times daily for 7 days. 10/28/18 11/04/18  Duffy Bruce, MD  cyclobenzaprine (FLEXERIL) 10 MG tablet Take 1 tablet (10 mg total) by mouth 3 (three) times daily as needed for muscle spasms. 12/29/15   Triplett, Johnette Abraham B, FNP  HYDROcodone-acetaminophen (NORCO) 5-325 MG tablet Take 1-2 tablets by mouth every 4 (four) hours as needed for moderate pain. 06/10/15   Beers, Pierce Crane, PA-C  ibuprofen (ADVIL) 600 MG tablet Take 1 tablet (600 mg total) by mouth every 8 (eight) hours as needed for moderate pain. 10/28/18   Duffy Bruce, MD  lidocaine (XYLOCAINE) 2 % solution Use as directed 10 mLs in the mouth or throat as needed for mouth pain. 10/04/16   Laban Emperor, PA-C  naproxen (NAPROSYN) 500 MG  tablet Take 1 tablet (500 mg total) by mouth 2 (two) times daily with a meal. 12/29/15   Triplett, Cari B, FNP  predniSONE (DELTASONE) 10 MG tablet Take 6 tablets on day 1, take 5 tablets on day 2, take 4 tablets on day 3, take 3 tablets on day 4, take 2 tablets on day 5, take 1 tablet on day 6 10/04/16   Laban Emperor, PA-C  sulfamethoxazole-trimethoprim (BACTRIM DS) 800-160 MG tablet Take 1 tablet by mouth 2 (two) times daily for 7 days. 10/28/18 11/04/18  Duffy Bruce, MD    Allergies Patient has no known allergies.  History reviewed. No pertinent family history.  Social History Social History   Tobacco Use  . Smoking status: Current Some Day Smoker  . Smokeless tobacco: Never Used  Substance Use Topics  . Alcohol use: Yes  . Drug use: Yes    Types: Marijuana    Review of Systems  Review of Systems  Constitutional: Negative for chills, fatigue and fever.  HENT: Negative for sore throat.   Respiratory: Negative for shortness of breath.   Cardiovascular: Negative for chest pain.  Gastrointestinal: Negative for abdominal pain.  Genitourinary: Negative for flank pain.  Musculoskeletal: Negative for neck pain.  Skin: Positive for rash and wound.  Allergic/Immunologic: Negative for immunocompromised state.  Neurological: Negative for weakness and numbness.  Hematological: Does not bruise/bleed easily.     ____________________________________________  PHYSICAL EXAM:      VITAL SIGNS: ED Triage  Vitals  Enc Vitals Group     BP 10/28/18 0237 122/71     Pulse Rate 10/28/18 0237 61     Resp 10/28/18 0237 18     Temp 10/28/18 0237 98.3 F (36.8 C)     Temp Source 10/28/18 0237 Oral     SpO2 10/28/18 0237 100 %     Weight 10/28/18 0250 130 lb (59 kg)     Height 10/28/18 0250 5\' 9"  (1.753 m)     Head Circumference --      Peak Flow --      Pain Score 10/28/18 0250 9     Pain Loc --      Pain Edu? --      Excl. in [place]? --      Physical Exam Vitals signs and nursing  note reviewed.  Constitutional:      General: He is not in acute distress.    Appearance: He is well-developed.  HENT:     Head: Normocephalic and atraumatic.  Eyes:     Conjunctiva/sclera: Conjunctivae normal.  Neck:     Musculoskeletal: Neck supple.  Cardiovascular:     Rate and Rhythm: Normal rate and regular rhythm.     Heart sounds: Normal heart sounds.  Pulmonary:     Effort: Pulmonary effort is normal. No respiratory distress.     Breath sounds: No wheezing.  Abdominal:     General: There is no distension.  Skin:    General: Skin is warm.     Capillary Refill: Capillary refill takes less than 2 seconds.     Findings: No rash.  Neurological:     Mental Status: He is alert and oriented to person, place, and time.     Motor: No abnormal muscle tone.      UPPER EXTREMITY EXAM: RIGHT  INSPECTION & PALPATION: Right index finger with macerated tissue and moderate edema along entire proximal phalanx, with open wound from skin breakdown along ulnar aspect of phalanx. Mild diffuse TTP. No fusiform swelling of digit, no TTP beyond proximal phalanx.  SENSORY: Sensation is intact to light touch in:  Superficial radial nerve distribution (dorsal first web space) Median nerve distribution (tip of index finger)   Ulnar nerve distribution (tip of small finger)     MOTOR:  + Motor posterior interosseous nerve (thumb IP extension) + Anterior interosseous nerve (thumb IP flexion, index finger DIP flexion) + Radial nerve (wrist extension) + Median nerve (palpable firing thenar mass) + Ulnar nerve (palpable firing of first dorsal interosseous muscle)  VASCULAR: 2+ radial pulse Brisk capillary refill < 2 sec, fingers warm and well-perfused    ____________________________________________   LABS (all labs ordered are listed, but only abnormal results are displayed)  Labs Reviewed - No data to display  ____________________________________________  EKG: None  ________________________________________  RADIOLOGY All imaging, including plain films, CT scans, and ultrasounds, independently reviewed by me, and interpretations confirmed via formal radiology reads.  ED MD interpretation:   XR R Index Finger: Neg  Official radiology report(s): Dg Finger Index Right  Result Date: 10/28/2018 CLINICAL DATA:  Initial evaluation for acute right finger pain, concern for infection. EXAM: RIGHT INDEX FINGER 2+V COMPARISON:  None. FINDINGS: Focal soft tissue swelling seen at the ulnar aspect of the mid right index finger. No radiopaque foreign body or soft tissue emphysema. No radiographic evidence for acute osteomyelitis. No acute fracture dislocation. Joint spaces well maintained. Osseous mineralization normal. IMPRESSION: 1. Focal soft tissue swelling at the ulnar  aspect of the mid right index finger, concerning for focal infection given provided history. No radiopaque foreign body or soft tissue emphysema. 2. No acute osseous abnormality identified. No evidence for associated osteomyelitis. Electronically Signed   By: Jeannine Boga M.D.   On: 10/28/2018 07:10    ____________________________________________  PROCEDURES   Procedure(s) performed (including Critical Care):  Marland KitchenMarland KitchenIncision and Drainage  Date/Time: 10/28/2018 10:19 AM Performed by: Duffy Bruce, MD Authorized by: Duffy Bruce, MD   Consent:    Consent obtained:  Verbal   Consent given by:  Patient   Risks discussed:  Bleeding, damage to other organs, incomplete drainage, infection and pain   Alternatives discussed:  Alternative treatment and delayed treatment Location:    Type:  Abscess   Size:  1 x 2   Location: Right index. Pre-procedure details:    Skin preparation:  Betadine Anesthesia (see MAR for exact dosages):    Anesthesia method:  Nerve block   Block location:  Digital block   Block needle gauge:  27 G   Block anesthetic:  Lidocaine 1% w/o epi and bupivacaine 0.5%  w/o epi   Block technique:  Digital block, along volar aspect of MCP crease   Block injection procedure:  Anatomic landmarks identified   Block outcome:  Anesthesia achieved Procedure type:    Complexity:  Simple Procedure details:    Incision types:  Single straight   Incision depth:  Dermal   Scalpel blade:  11   Wound management:  Probed and deloculated and irrigated with saline   Drainage:  Purulent   Drainage amount:  Moderate   Wound treatment:  Wound left open   Packing materials:  1/4 in iodoform gauze Post-procedure details:    Patient tolerance of procedure:  Tolerated well, no immediate complications    ____________________________________________  INITIAL IMPRESSION / MDM / ASSESSMENT AND PLAN / ED COURSE  As part of my medical decision making, I reviewed the following data within the [place] Notes from prior ED visits and [place] Controlled Substance Database      *Adedayo Stoessel Nocera was evaluated in Emergency Department on 10/28/2018 for the symptoms described in the history of present illness. He was evaluated in the context of the global COVID-19 pandemic, which necessitated consideration that the patient might be at risk for infection with the SARS-CoV-2 virus that causes COVID-19. Institutional protocols and algorithms that pertain to the evaluation of patients at risk for COVID-19 are in a state of rapid change based on information released by regulatory bodies including the CDC and federal and state organizations. These policies and algorithms were followed during the patient's care in the ED.  Some ED evaluations and interventions may be delayed as a result of limited staffing during the pandemic.*      Medical Decision Making: 28 yo right hand dominant male here with R index finger pain and swelling. On exam, he has small focal abscess of ulnar aspect of proximal phalanx of index finger on R hand, with mild surrounding edema. No TTP along entire  remainder of flexor sheath with no other clinical evidence of flexor teno. No signs of streaking erythema or proximal cellulitis. He is not diabetic. Plain films show no visible FB. There is already a superficial, irregular opening in the area 2/2 skin breakdown from home attempts at drainage. This area was sharply incised and the abscess cavity entered and irrigated. Iodoform packing placed. Discussed referral vs observation and pt would like to attempt outpt management  w/o surgical referral at this time. Empiric ABX started and will lhave him return in 48 hours.  ____________________________________________  FINAL CLINICAL IMPRESSION(S) / ED DIAGNOSES  Final diagnoses:  Abscess of finger, right     MEDICATIONS GIVEN DURING THIS VISIT:  Medications  bupivacaine (MARCAINE) 0.5 % injection 10 mL (10 mLs Infiltration Given by Other 10/28/18 0547)  lidocaine (PF) (XYLOCAINE) 1 % injection 5 mL (5 mLs Infiltration Given by Other 10/28/18 0547)  HYDROcodone-acetaminophen (NORCO/VICODIN) 5-325 MG per tablet 2 tablet (2 tablets Oral Given 10/28/18 0546)     ED Discharge Orders         Ordered    cephALEXin (KEFLEX) 500 MG capsule  3 times daily     10/28/18 0714    sulfamethoxazole-trimethoprim (BACTRIM DS) 800-160 MG tablet  2 times daily     10/28/18 0714    ibuprofen (ADVIL) 600 MG tablet  Every 8 hours PRN     10/28/18 [id number]           Note:  This document was prepared using Dragon voice recognition software and may include unintentional dictation errors.   Duffy Bruce, MD 10/28/18 1022

## 2018-10-28 NOTE — Discharge Instructions (Signed)
Keep the wound packing in place for 48 hours.  Return to the ER in 48 hours for wound check and packing removal.  Do not submerge your hand and try to keep the hand clean and dry.  Take the antibiotics as prescribed.  There are 2 different anabiotics  It is okay to shower, but be careful not to actually remove the packing.

## 2018-10-28 NOTE — ED Notes (Addendum)
Patient ambulatory to lobby with steady gait and NAD noted. Verbalized understanding of discharge instructions and follow-up care.  

## 2018-10-28 NOTE — ED Notes (Signed)
Pt reports extreme pain at right index finger worsening over last month, pt has ulcer one in healing at anterior of second joint and white center ulcer at medial aspect of same joint, finger appears grossly swollen, diminished movement and CMS intact

## 2018-10-30 ENCOUNTER — Encounter: Payer: Self-pay | Admitting: Emergency Medicine

## 2018-10-30 ENCOUNTER — Emergency Department
Admission: EM | Admit: 2018-10-30 | Discharge: 2018-10-30 | Disposition: A | Discharge status: Home / Self Care | Payer: Self-pay | Attending: Emergency Medicine | Admitting: Emergency Medicine

## 2018-10-30 ENCOUNTER — Other Ambulatory Visit: Payer: Self-pay

## 2018-10-30 DIAGNOSIS — Z09 Encounter for follow-up examination after completed treatment for conditions other than malignant neoplasm: Secondary | ICD-10-CM

## 2018-10-30 DIAGNOSIS — F17210 Nicotine dependence, cigarettes, uncomplicated: Secondary | ICD-10-CM | POA: Insufficient documentation

## 2018-10-30 DIAGNOSIS — Z791 Long term (current) use of non-steroidal anti-inflammatories (NSAID): Secondary | ICD-10-CM | POA: Insufficient documentation

## 2018-10-30 DIAGNOSIS — L02413 Cutaneous abscess of right upper limb: Secondary | ICD-10-CM | POA: Insufficient documentation

## 2018-10-30 MED ORDER — OXYCODONE-ACETAMINOPHEN 7.5-325 MG PO TABS: 1.0000 | ORAL_TABLET | Freq: Four times a day (QID) | ORAL | 0 refills | Status: DC | PRN

## 2018-10-30 MED ORDER — LIDOCAINE HCL (PF) 1 % IJ SOLN
INTRAMUSCULAR | Status: AC
  Administered 2018-10-30: 15:00:00 5 mL
  Filled 2018-10-30: qty 5

## 2018-10-30 MED ORDER — LIDOCAINE HCL (PF) 1 % IJ SOLN
5.0000 mL | Freq: Once | INTRAMUSCULAR | Status: AC
  Administered 2018-10-30: 15:00:00 5 mL

## 2018-10-30 NOTE — ED Provider Notes (Signed)
[facility] Emergency Department Provider Note   ____________________________________________   First MD Initiated Contact with Patient 10/30/18 1353     (approximate)  I have reviewed the triage vital signs and the nursing notes.   HISTORY  Chief Complaint Wound Check    HPI Trevor Rocha is a 28 y.o. male patient presents for wound check secondary to have an abscess I&D of right index finger.  Patient rates his pain as a 10/10.  Patient say ibuprofen is not helping with the pain.  Patient described the pain as" sore".  Procedure performed 2 days ago at this facility.      History reviewed. No pertinent past medical history.  There are no active problems to display for this patient.   History reviewed. No pertinent surgical history.  Prior to Admission medications   Medication Sig Start Date End Date Taking? Authorizing Provider  cephALEXin (KEFLEX) 500 MG capsule Take 1 capsule (500 mg total) by mouth 3 (three) times daily for 7 days. 10/28/18 11/04/18  Duffy Bruce, MD  cyclobenzaprine (FLEXERIL) 10 MG tablet Take 1 tablet (10 mg total) by mouth 3 (three) times daily as needed for muscle spasms. 12/29/15   Triplett, Johnette Abraham B, FNP  HYDROcodone-acetaminophen (NORCO) 5-325 MG tablet Take 1-2 tablets by mouth every 4 (four) hours as needed for moderate pain. 06/10/15   Beers, Pierce Crane, PA-C  ibuprofen (ADVIL) 600 MG tablet Take 1 tablet (600 mg total) by mouth every 8 (eight) hours as needed for moderate pain. 10/28/18   Duffy Bruce, MD  lidocaine (XYLOCAINE) 2 % solution Use as directed 10 mLs in the mouth or throat as needed for mouth pain. 10/04/16   Laban Emperor, PA-C  naproxen (NAPROSYN) 500 MG tablet Take 1 tablet (500 mg total) by mouth 2 (two) times daily with a meal. 12/29/15   Triplett, Cari B, FNP  oxyCODONE-acetaminophen (PERCOCET) 7.5-325 MG tablet Take 1 tablet by mouth every 6 (six) hours as needed. 10/30/18   Sable Feil, PA-C   predniSONE (DELTASONE) 10 MG tablet Take 6 tablets on day 1, take 5 tablets on day 2, take 4 tablets on day 3, take 3 tablets on day 4, take 2 tablets on day 5, take 1 tablet on day 6 10/04/16   Laban Emperor, PA-C  sulfamethoxazole-trimethoprim (BACTRIM DS) 800-160 MG tablet Take 1 tablet by mouth 2 (two) times daily for 7 days. 10/28/18 11/04/18  Duffy Bruce, MD    Allergies Patient has no known allergies.  No family history on file.  Social History Social History   Tobacco Use  . Smoking status: Current Some Day Smoker  . Smokeless tobacco: Never Used  Substance Use Topics  . Alcohol use: Yes  . Drug use: Yes    Types: Marijuana    Review of Systems Constitutional: No fever/chills Eyes: No visual changes. ENT: No sore throat. Cardiovascular: Denies chest pain. Respiratory: Denies shortness of breath. Gastrointestinal: No abdominal pain.  No nausea, no vomiting.  No diarrhea.  No constipation. Genitourinary: Negative for dysuria. Musculoskeletal: Negative for back pain. Skin: Negative for rash.  Abscess right index finger Neurological: Negative for headaches, focal weakness or numbness.   ____________________________________________   PHYSICAL EXAM:  VITAL SIGNS: ED Triage Vitals [10/30/18 1410]  Enc Vitals Group     BP 129/63     Pulse Rate (!) 55     Resp 18     Temp 98.5 F (36.9 C)     Temp src  SpO2 100 %     Weight      Height      Head Circumference      Peak Flow      Pain Score 8     Pain Loc      Pain Edu?      Excl. in [place]?    Constitutional: Alert and oriented. Well appearing and in no acute distress. Cardiovascular: Normal rate, regular rhythm. Grossly normal heart sounds.  Good peripheral circulation. Respiratory: Normal respiratory effort.  No retractions. Lungs CTAB. Skin: Edematous erythematous right index finger. Psychiatric: Mood and affect are normal. Speech and behavior are normal.   ____________________________________________   LABS (all labs ordered are listed, but only abnormal results are displayed)  Labs Reviewed - No data to display ____________________________________________  EKG   ____________________________________________  RADIOLOGY  ED MD interpretation:    Official radiology report(s): No results found.  ____________________________________________   PROCEDURES  Procedure(s) performed (including Critical Care):  Procedures   ____________________________________________   INITIAL IMPRESSION / ASSESSMENT AND PLAN / ED COURSE  As part of my medical decision making, I reviewed the following data within the [place] was evaluated in Emergency Department on 10/30/2018 for the symptoms described in the history of present illness. He was evaluated in the context of the global COVID-19 pandemic, which necessitated consideration that the patient might be at risk for infection with the SARS-CoV-2 virus that causes COVID-19. Institutional protocols and algorithms that pertain to the evaluation of patients at risk for COVID-19 are in a state of rapid change based on information released by regulatory bodies including the CDC and federal and state organizations. These policies and algorithms were followed during the patient's care in the ED.   Patient presents for wound check secondary to incision and drainage of abscess to right index finger.  Patient stated pain is his chief complaint today.  Packing material was removed and copious irrigation with producing clear return.  Patient was re-bandage and given discharge care instruction.  Advised continue previous medications.      ____________________________________________   FINAL CLINICAL IMPRESSION(S) / ED DIAGNOSES  Final diagnoses:  Encounter for recheck of abscess following incision and drainage     ED Discharge Orders         Ordered     oxyCODONE-acetaminophen (PERCOCET) 7.5-325 MG tablet  Every 6 hours PRN     10/30/18 1455           Note:  This document was prepared using Dragon voice recognition software and may include unintentional dictation errors.    Sable Feil, PA-C 10/30/18 1457    Earleen Newport, MD 10/30/18 [phone]

## 2018-10-30 NOTE — ED Triage Notes (Signed)
Pt sates here for wound recheck and packing removal.  Pt states wound is more painful because it is now and open wound, but the swelling is much improved.

## 2020-01-25 IMAGING — DX DG FINGER INDEX 2+V*R*
3 series · 3 of 3 positions shown · non-contrast
Comparison: None.

CLINICAL DATA: Initial evaluation for acute right finger pain,
concern for infection.

EXAM:
RIGHT INDEX FINGER 2+V

[finger ap]
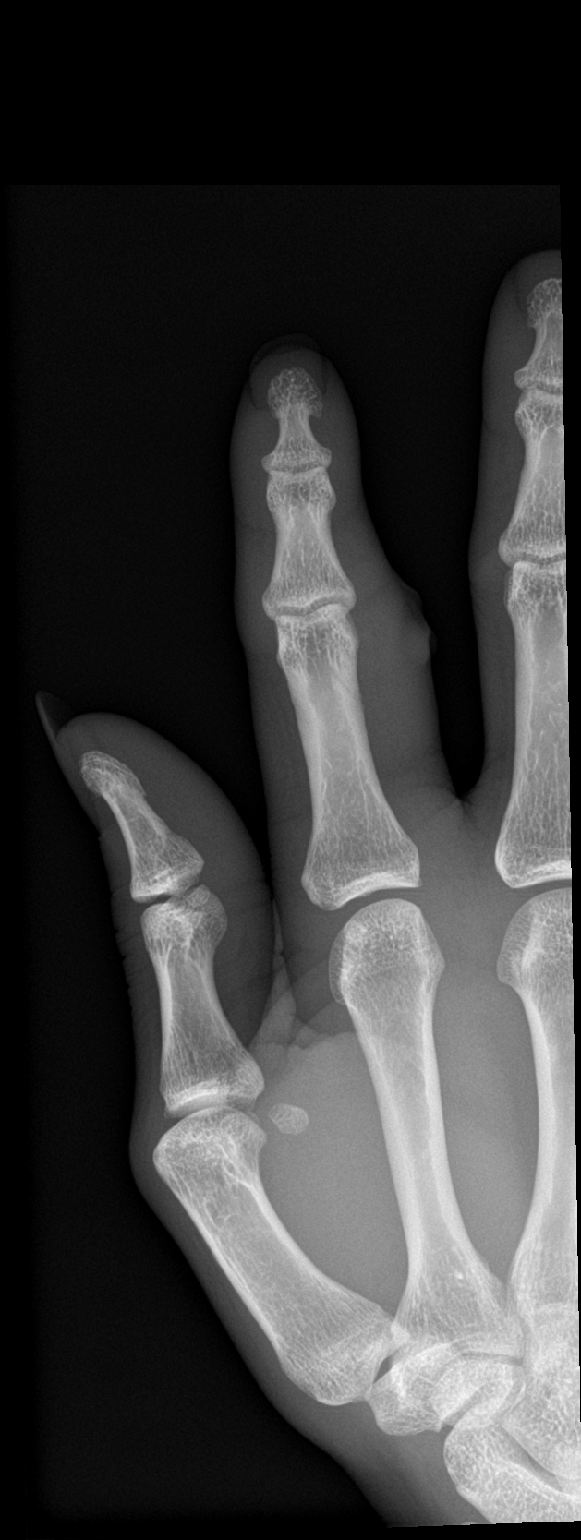

[finger obl]
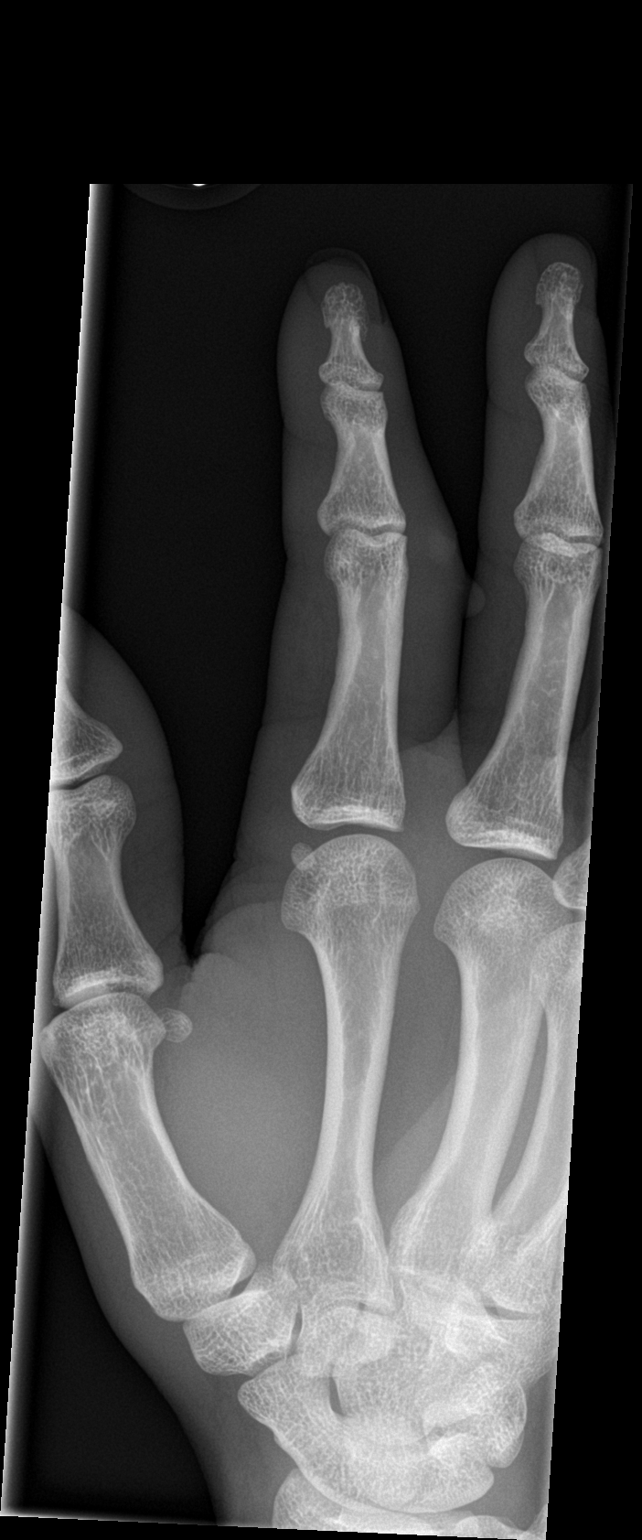

[finger lat]
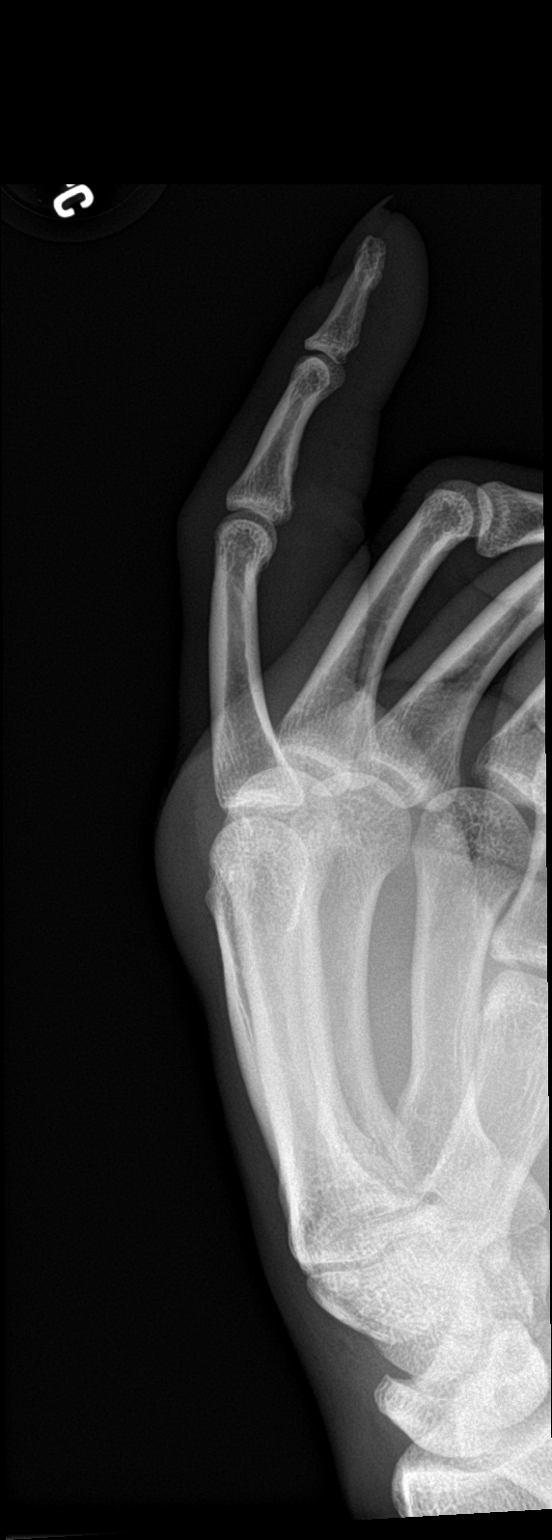

[3 of 3 positions shown; findings below may reference images not displayed]

FINDINGS: Focal soft tissue swelling seen at the ulnar aspect of the mid right
index finger. No radiopaque foreign body or soft tissue emphysema.
No radiographic evidence for acute osteomyelitis. No acute fracture
dislocation. Joint spaces well maintained. Osseous mineralization
normal.
IMPRESSION: 1. Focal soft tissue swelling at the ulnar aspect of the mid right
index finger, concerning for focal infection given provided history.
No radiopaque foreign body or soft tissue emphysema.
2. No acute osseous abnormality identified. No evidence for
associated osteomyelitis.

## 2021-03-11 ENCOUNTER — Emergency Department
Admission: EM | Admit: 2021-03-11 | Discharge: 2021-03-11 | Disposition: A | Discharge status: Home / Self Care | Payer: Self-pay | Attending: Emergency Medicine | Admitting: Emergency Medicine

## 2021-03-11 ENCOUNTER — Other Ambulatory Visit: Payer: Self-pay

## 2021-03-11 DIAGNOSIS — D369 Benign neoplasm, unspecified site: Secondary | ICD-10-CM

## 2021-03-11 MED ORDER — SULFAMETHOXAZOLE-TRIMETHOPRIM 800-160 MG PO TABS: 1.0000 | ORAL_TABLET | Freq: Two times a day (BID) | ORAL | 0 refills | Status: AC

## 2021-03-11 NOTE — ED Triage Notes (Signed)
Pt c/o redness, swelling and tenderness to the right cheek for the past week

## 2021-03-11 NOTE — ED Triage Notes (Addendum)
Cyst to right face.  Seen for same several years ago, was prescribed antibiotics which resolved.  States returned over the weekend.

## 2021-03-11 NOTE — Discharge Instructions (Signed)
Take the antibiotic as directed. Return to the ED if increased pain, redness, or spontaneous drainage. Follow-up with Dermatology for excisional procedure to remove the cyst once this infected/inflamed status is resolved.

## 2021-03-11 NOTE — ED Notes (Signed)
Pt to ED for abscess over R cheekbone that began 5 days ago. Area is about 2cm by 2cm, swollen, slightly reddened. Has been applying warm compresses. Has not squeezed it. Pt had this exact thing on same area 2 years ago, treated successfully with abx which resolved the abscess, only leaving a "small bump" in same area. Area is not painful, just tender to touch. Pt also had HA yesterday which is rare for him.

## 2021-03-11 NOTE — ED Provider Notes (Signed)
° °  [facility] Provider Note  Patient Contact: 11:19 AM (approximate)   History   Abscess   HPI  Trevor Rocha is a 31 y.o. 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 He denies any fevers, chills, or spontaneous drainage. He had the same cyst treated several years ago with antibiotics, with spontaneous resolution.   Physical Exam   Triage Vital Signs: ED Triage Vitals  Enc Vitals Group     BP 03/11/21 0910 (!) 146/95     Pulse Rate 03/11/21 0910 68     Resp 03/11/21 0910 16     Temp 03/11/21 0910 98.3 F (36.8 C)     Temp Source 03/11/21 0910 Oral     SpO2 03/11/21 0910 98 %     Weight 03/11/21 0910 130 lb 1.1 oz (59 kg)     Height 03/11/21 0910 5\' 9"  (1.753 m)     Head Circumference --      Peak Flow --      Pain Score 03/11/21 0910 2     Pain Loc --      Pain Edu? --      Excl. in [place]? --     Most recent vital signs: Vitals:   03/11/21 0910  BP: (!) 146/95  Pulse: 68  Resp: 16  Temp: 98.3 F (36.8 C)  SpO2: 98%     General: Alert and in no acute distress.   Head: No acute traumatic findings except for a cystic lesion to the subcutaneous right malar cheek. No punctum or spontaneous purulent drainage Cardiovascular:  Good peripheral perfusion Respiratory: Normal respiratory effort without tachypnea or retractions. Lungs CTAB.  Musculoskeletal: Full range of motion to all extremities.  Neurologic:  No gross focal neurologic deficits are appreciated.  Skin:   No rash noted Other:   ED Results / Procedures / Treatments   Labs (all labs ordered are listed, but only abnormal results are displayed) Labs Reviewed - No data to display   EKG   RADIOLOGY   No results found.  PROCEDURES:  Critical Care performed: No  Procedures   MEDICATIONS ORDERED IN ED: Medications - No data to display   IMPRESSION / MDM / [place] / ED COURSE  I reviewed the triage vital signs and the nursing  notes.                              Differential diagnosis includes, but is not limited to, facial cellulitis, infected sebaceous cyst, folliculitis  Patient's diagnosis is consistent with infected sebaceous cyst. Patient will be discharged home with prescriptions for Bactrim DS. Patient is to follow up with Dermatology as directed. Patient is given ED precautions to return to the ED for any worsening or new symptoms.   FINAL CLINICAL IMPRESSION(S) / ED DIAGNOSES   Final diagnoses:  Dermoid cyst     Rx / DC Orders   ED Discharge Orders          Ordered    sulfamethoxazole-trimethoprim (BACTRIM DS) 800-160 MG tablet  2 times daily        03/11/21 1126             Note:  This document was prepared using Dragon voice recognition software and may include unintentional dictation errors.    Melvenia Needles, PA-C 03/11/21 2029    Nena Polio, MD 03/12/21 [phone]

## 2022-09-20 ENCOUNTER — Other Ambulatory Visit: Payer: Self-pay

## 2022-09-20 ENCOUNTER — Emergency Department
Admission: EM | Admit: 2022-09-20 | Discharge: 2022-09-20 | Disposition: A | Discharge status: Home / Self Care | Payer: Self-pay | Attending: Emergency Medicine | Admitting: Emergency Medicine

## 2022-09-20 ENCOUNTER — Emergency Department: Payer: Self-pay

## 2022-09-20 DIAGNOSIS — M549 Dorsalgia, unspecified: Secondary | ICD-10-CM

## 2022-09-20 DIAGNOSIS — M25511 Pain in right shoulder: Secondary | ICD-10-CM | POA: Insufficient documentation

## 2022-09-20 DIAGNOSIS — M542 Cervicalgia: Secondary | ICD-10-CM | POA: Insufficient documentation

## 2022-09-20 DIAGNOSIS — M546 Pain in thoracic spine: Secondary | ICD-10-CM | POA: Insufficient documentation

## 2022-09-20 MED ORDER — CYCLOBENZAPRINE HCL 5 MG PO TABS: 5.0000 mg | ORAL_TABLET | Freq: Three times a day (TID) | ORAL | 0 refills | Status: AC | PRN

## 2022-09-20 MED ORDER — LIDOCAINE 5 % EX PTCH: 1.0000 | MEDICATED_PATCH | Freq: Two times a day (BID) | CUTANEOUS | 0 refills | Status: AC

## 2022-09-20 NOTE — ED Triage Notes (Signed)
Pt presents to ED with c/o of R shoulder pain and neck pain. Pt states he happened while eating. Pt states pain started last night at 0830.

## 2022-09-20 NOTE — Discharge Instructions (Signed)
As we discussed please use your Lidoderm patches and Flexeril as needed, as prescribed.  You may also use Tylenol or ibuprofen as written on the box as needed for discomfort.  You may find a heating pad to be beneficial.  Return to the emergency department for any worsening pain or any other symptom concerning to yourself

## 2022-09-20 NOTE — ED Provider Notes (Signed)
   [facility] Provider Note    Event Date/Time   First MD Initiated Contact with Patient 09/20/22 1104     (approximate)  History   Chief Complaint: Shoulder Pain and Torticollis  HPI  Trevor Rocha is a 32 y.o. male with no significant past medical history who presents to the emergency department for right shoulder/back pain.  According to the patient he states that last night he was reaching with his arm when he felt pain in the right neck down into the right upper back.  He states it felt like the muscles "locked up".  He states today he has pain anytime he moves the arm or bends forward.  States the pain is mostly in the right neck extending down into the right shoulder blade.  No shortness of breath no anterior chest pain.  Physical Exam   Triage Vital Signs: ED Triage Vitals  Encounter Vitals Group     BP 09/20/22 1006 130/84     Systolic BP Percentile --      Diastolic BP Percentile --      Pulse Rate 09/20/22 1006 64     Resp 09/20/22 1006 17     Temp 09/20/22 1006 98.2 F (36.8 C)     Temp Source 09/20/22 1006 Oral     SpO2 09/20/22 1006 100 %     Weight 09/20/22 1003 140 lb (63.5 kg)     Height 09/20/22 1003 5\' 9"  (1.753 m)     Head Circumference --      Peak Flow --      Pain Score --      Pain Loc --      Pain Education --      Exclude from Growth Chart --     Most recent vital signs: Vitals:   09/20/22 1006  BP: 130/84  Pulse: 64  Resp: 17  Temp: 98.2 F (36.8 C)  SpO2: 100%    General: Awake, no distress.  CV:  Good peripheral perfusion.  Regular rate and rhythm  Resp:  Normal effort.  Equal breath sounds bilaterally.  Abd:  No distention.  Other:  Moderate tenderness along the trapezius into the supraspinatus/rhomboideus muscles.  Tenderness is very reproducible with palpation or movement of the right arm.   ED Results / Procedures / Treatments   RADIOLOGY  I have reviewed and interpreted the shoulder x-ray  images.  No obvious bony abnormality my evaluation. Radiology is read the x-ray as negative   MEDICATIONS ORDERED IN ED: Medications - No data to display   IMPRESSION / MDM / ASSESSMENT AND PLAN / ED COURSE  I reviewed the triage vital signs and the nursing notes.  Patient's presentation is most consistent with acute presentation with potential threat to life or bodily function.  Patient presents to the emergency department for right shoulder pain extending from the right neck down into the shoulder blade.  Overall the patient appears well, no distress.  Given the reproducibility with palpation or movement highly suspect muscular pain.  We will prescribe Flexeril and lidocaine patches for the patient.  Discussed over-the-counter Tylenol/ibuprofen as needed for discomfort.  Patient agreeable to plan of care.  FINAL CLINICAL IMPRESSION(S) / ED DIAGNOSES   Musculoskeletal pain  Rx / DC Orders   Lidoderm patch Flexeril  Note:  This document was prepared using Dragon voice recognition software and may include unintentional dictation errors.   Minna Antis, MD 09/20/22 1124
